# Patient Record
Sex: Male | Born: 1995 | Race: White | Hispanic: No | Marital: Single | Smoking: Never smoker
Health system: Southern US, Community
[De-identification: ages and names within clinical notes are randomized; demographics above are authoritative.]

---

## 2016-11-05 ENCOUNTER — Emergency Department
Admission: EM | Admit: 2016-11-05 | Discharge: 2016-11-05 | Disposition: A | Discharge status: Home / Self Care | Payer: PRIVATE HEALTH INSURANCE | Attending: Emergency Medicine | Admitting: Emergency Medicine

## 2016-11-05 ENCOUNTER — Emergency Department: Payer: PRIVATE HEALTH INSURANCE

## 2016-11-05 ENCOUNTER — Encounter: Payer: Self-pay | Admitting: Emergency Medicine

## 2016-11-05 DIAGNOSIS — S93401A Sprain of unspecified ligament of right ankle, initial encounter: Secondary | ICD-10-CM

## 2016-11-05 DIAGNOSIS — W1789XA Other fall from one level to another, initial encounter: Secondary | ICD-10-CM | POA: Insufficient documentation

## 2016-11-05 DIAGNOSIS — Y999 Unspecified external cause status: Secondary | ICD-10-CM | POA: Diagnosis not present

## 2016-11-05 DIAGNOSIS — Y929 Unspecified place or not applicable: Secondary | ICD-10-CM | POA: Insufficient documentation

## 2016-11-05 DIAGNOSIS — S91011A Laceration without foreign body, right ankle, initial encounter: Secondary | ICD-10-CM | POA: Diagnosis not present

## 2016-11-05 DIAGNOSIS — Y939 Activity, unspecified: Secondary | ICD-10-CM | POA: Insufficient documentation

## 2016-11-05 MED ORDER — IBUPROFEN 600 MG PO TABS: 600.0000 mg | ORAL_TABLET | Freq: Three times a day (TID) | ORAL | 0 refills | Status: AC | PRN

## 2016-11-05 NOTE — ED Provider Notes (Signed)
[facility] Emergency Department Provider Note  ____________________________________________   First MD Initiated Contact with Patient 11/05/16 [phone]     (approximate)  I have reviewed the triage vital signs and the nursing notes.   HISTORY  Chief Complaint Ankle Pain    HPI Birt Reinoso is a 21 y.o. male is here with  complaint of right ankle injury.patient reports falling off of a table and possibly 5 AM this morning. Patient has been unable to bear weight on his right ankle since the injury. He denies any head injury or loss of consciousness.she rates his pain as 6/10.   He denies any use of alcohol during this event.   History reviewed. No pertinent past medical history.  There are no active problems to display for this patient.   History reviewed. No pertinent surgical history.  Prior to Admission medications   Medication Sig Start Date End Date Taking? Authorizing Provider  ibuprofen (ADVIL,MOTRIN) 600 MG tablet Take 1 tablet (600 mg total) by mouth every 8 (eight) hours as needed. 11/05/16   Tommi Rumps, PA-C    Allergies Patient has no known allergies.  No family history on file.  Social History Social History  Substance Use Topics  . Smoking status: Never Smoker  . Smokeless tobacco: Never Used  . Alcohol use No    Review of Systems Constitutional: No fever/chills Eyes: No visual changes. ENT:  No trauma Cardiovascular: Denies chest pain. Respiratory: Denies shortness of breath. Gastrointestinal: No abdominal pain.  No nausea, no vomiting.   Musculoskeletal: Negative for back pain. positive right ankle pain. Skin: Negative for rash.positive abrasion right ankle. Neurological: Negative for headaches, focal weakness or numbness.  10-point ROS otherwise negative.  ____________________________________________   PHYSICAL EXAM:  VITAL SIGNS: ED Triage Vitals  Enc Vitals Group     BP 11/05/16 0604 (!) 121/48    Pulse Rate 11/05/16 0604 82     Resp 11/05/16 0604 20     Temp 11/05/16 0604 97.9 F (36.6 C)     Temp Source 11/05/16 0604 Oral     SpO2 11/05/16 0604 98 %     Weight 11/05/16 0603 265 lb (120.2 kg)     Height 11/05/16 0603  (1.88 m)     Head Circumference --      Peak Flow --      Pain Score 11/05/16 0603 6     Pain Loc --      Pain Edu? --      Excl. in GC? --     Constitutional: Alert and oriented. Well appearing and in no acute distress. Eyes: Conjunctivae are normal. PERRL. EOMI. Head: Atraumatic. Nose: No congestion/rhinnorhea. Neck: No stridor.   Cardiovascular: Normal rate, regular rhythm. Grossly normal heart sounds.  Good peripheral circulation. Respiratory: Normal respiratory effort.  No retractions. Lungs CTAB. Gastrointestinal: Soft and nontender. No distention.  Musculoskeletal: Ms. Patton Salles and lower extremities without any difficulty. Patient states he is unable to bear weight secondary to his injury. On examination there is no gross deformity of the lower right extremity. There is some tenderness on palpation of the right ankle and especially posteriorly. There is a very superficial abrasion noted posterior ankle. Achilles tendon appears to be well intact. Thompson's sign is negative for evidence of a rupture. Remaining skin without injury. Neurologic:  Normal speech and language. No gross focal neurologic deficits are appreciated. No gait instability. Skin:  Skin is warm, dry.  Duration is noted above. Psychiatric: Mood  and affect are normal. Speech and behavior are normal.  ____________________________________________   LABS (all labs ordered are listed, but only abnormal results are displayed)  Labs Reviewed - No data to display  RADIOLOGY  Right ankle per radiologist is negative for fracture. I, Tommi Rumps, personally viewed and evaluated these images (plain radiographs) as part of my medical decision making, as well as reviewing the written  report by the radiologist.  ____________________________________________   PROCEDURES  Procedure(s) performed: None  Procedures  Critical Care performed: No  ____________________________________________   INITIAL IMPRESSION / ASSESSMENT AND PLAN / ED COURSE  Pertinent labs & imaging results that were available during my care of the patient were reviewed by me and considered in my medical decision making (see chart for details).  Patient is placed in a Velcro stirrup ankle splint however he is still unable to bear weight. Patient was given a set of crutches to use. He is given a prescription for ibuprofen 600 mg 3 times a day with food. He is to ice and elevate his ankle as needed for pain and swelling. He will follow up with [facility] if any problems and Dr. Rosita Kea if any severe worsening of his ankle.      ____________________________________________   FINAL CLINICAL IMPRESSION(S) / ED DIAGNOSES  Final diagnoses:  Sprain of right ankle, unspecified ligament, initial encounter  Laceration of right ankle, initial encounter      NEW MEDICATIONS STARTED DURING THIS VISIT:  Discharge Medication List as of 11/05/2016  8:35 AM    START taking these medications   Details  ibuprofen (ADVIL,MOTRIN) 600 MG tablet Take 1 tablet (600 mg total) by mouth every 8 (eight) hours as needed., Starting Wed 11/05/2016, Print         Note:  This document was prepared using Dragon voice recognition software and may include unintentional dictation errors.    Tommi Rumps, PA-C 11/05/16 1125    Governor Rooks, MD 11/05/16 [phone]

## 2016-11-05 NOTE — ED Notes (Signed)
C/O right ankle pain.  + radial pulse.  + CMS.  NAD

## 2016-11-05 NOTE — Discharge Instructions (Signed)
Ice and elevate the ankle as needed for swelling. Watch abrasion for signs of infection. Ibuprofen 3 times a day with food as needed for pain and inflammation. Wear ankle brace for support. Follow up with [facility] if any continued problems. If any worsening of your ankle symptoms you  may call and make an appointment with [facility] clinic orthopedic department. Dr. Rosita Kea is on call today.  Use crutches for support

## 2016-11-05 NOTE — ED Triage Notes (Signed)
Pt to triage via w/c with no distress noted; pt reports at 5am falling off a table, injuring right ankle

## 2016-11-05 NOTE — ED Notes (Signed)
AAOx3.  Skin warm and dry.  NAD 

## 2017-04-25 ENCOUNTER — Emergency Department: Payer: PRIVATE HEALTH INSURANCE

## 2017-04-25 ENCOUNTER — Emergency Department
Admission: EM | Admit: 2017-04-25 | Discharge: 2017-04-25 | Disposition: A | Discharge status: Home / Self Care | Payer: PRIVATE HEALTH INSURANCE | Attending: Emergency Medicine | Admitting: Emergency Medicine

## 2017-04-25 DIAGNOSIS — R509 Fever, unspecified: Secondary | ICD-10-CM | POA: Diagnosis present

## 2017-04-25 DIAGNOSIS — B349 Viral infection, unspecified: Secondary | ICD-10-CM | POA: Diagnosis not present

## 2017-04-25 LAB — POCT RAPID STREP A: STREPTOCOCCUS, GROUP A SCREEN (DIRECT): NEGATIVE

## 2017-04-25 LAB — URINALYSIS, ROUTINE W REFLEX MICROSCOPIC
BACTERIA UA: NONE SEEN
BILIRUBIN URINE: NEGATIVE
Glucose, UA: NEGATIVE mg/dL
Hgb urine dipstick: NEGATIVE
KETONES UR: 20 mg/dL — AB
Leukocytes, UA: NEGATIVE
NITRITE: NEGATIVE
PROTEIN: 30 mg/dL — AB
Specific Gravity, Urine: 1.021 (ref 1.005–1.030)
pH: 5 (ref 5.0–8.0)

## 2017-04-25 LAB — CBC
HEMATOCRIT: 44.2 % (ref 40.0–52.0)
Hemoglobin: 15.7 g/dL (ref 13.0–18.0)
MCH: 31.7 pg (ref 26.0–34.0)
MCHC: 35.6 g/dL (ref 32.0–36.0)
MCV: 88.9 fL (ref 80.0–100.0)
Platelets: 171 10*3/uL (ref 150–440)
RBC: 4.97 MIL/uL (ref 4.40–5.90)
RDW: 12.8 % (ref 11.5–14.5)
WBC: 8.8 10*3/uL (ref 3.8–10.6)

## 2017-04-25 LAB — BASIC METABOLIC PANEL
ANION GAP: 11 (ref 5–15)
BUN: 10 mg/dL (ref 6–20)
CALCIUM: 9.4 mg/dL (ref 8.9–10.3)
CO2: 25 mmol/L (ref 22–32)
CREATININE: 0.9 mg/dL (ref 0.61–1.24)
Chloride: 101 mmol/L (ref 101–111)
Glucose, Bld: 108 mg/dL — ABNORMAL HIGH (ref 65–99)
Potassium: 3.5 mmol/L (ref 3.5–5.1)
SODIUM: 137 mmol/L (ref 135–145)

## 2017-04-25 LAB — LACTIC ACID, PLASMA: Lactic Acid, Venous: 0.7 mmol/L (ref 0.5–1.9)

## 2017-04-25 LAB — MONONUCLEOSIS SCREEN: MONO SCREEN: NEGATIVE

## 2017-04-25 MED ORDER — IBUPROFEN 800 MG PO TABS
800.0000 mg | ORAL_TABLET | Freq: Once | ORAL | Status: AC
  Administered 2017-04-25: 800 mg via ORAL

## 2017-04-25 MED ORDER — IBUPROFEN 800 MG PO TABS
ORAL_TABLET | ORAL | Status: AC
  Filled 2017-04-25: qty 1

## 2017-04-25 MED ORDER — ACETAMINOPHEN 500 MG PO TABS
1000.0000 mg | ORAL_TABLET | Freq: Once | ORAL | Status: AC
  Administered 2017-04-25: 1000 mg via ORAL
  Filled 2017-04-25: qty 2

## 2017-04-25 NOTE — ED Provider Notes (Signed)
[facility] Emergency Department Provider Note  ____________________________________________   First MD Initiated Contact with Patient 04/25/17 0451     (approximate)  I have reviewed the triage vital signs and the nursing notes.   HISTORY  Chief Complaint Fever   HPI Philip Wheeler is a 21 y.o. male without any chronic medical problems was presented to the emergency department today with 4 days of fever. He says that his illness started this past Monday with ulcerations to the inside of his mouth. He says that he has had a sore throat with difficulty swallowing. No cough but then developed fever 3 days ago. He says that his fever has been less responsive to ibuprofen, and her milligrams, at home. He says that he is also had body aches and mild chest pain that hurts worse when he breathes in. 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 Denies any pressure to his ears at this time. Says that he had nausea prior to arrival but no vomiting.   No past medical history on file.  There are no active problems to display for this patient.   No past surgical history on file.  Prior to Admission medications   Medication Sig Start Date End Date Taking? Authorizing Provider  ibuprofen (ADVIL,MOTRIN) 600 MG tablet Take 1 tablet (600 mg total) by mouth every 8 (eight) hours as needed. 11/05/16   Tommi Rumps, PA-C    Allergies Patient has no known allergies.  No family history on file.  Social History Social History  Substance Use Topics  . Smoking status: Never Smoker  . Smokeless tobacco: Never Used  . Alcohol use No    Review of Systems  Constitutional:  fever/chills Eyes: No visual changes. ENT: as above Cardiovascular: Denies chest pain. Respiratory: Denies shortness of breath. Gastrointestinal: No abdominal pain.  no vomiting.  No diarrhea.  No constipation. Genitourinary:  Negative for dysuria. Musculoskeletal: Negative for back pain. Skin: Negative for rash. Neurological: Negative for headaches, focal weakness or numbness.   ____________________________________________   PHYSICAL EXAM:  VITAL SIGNS: ED Triage Vitals  Enc Vitals Group     BP 04/25/17 0035 (!) 168/69     Pulse Rate 04/25/17 0035 98     Resp 04/25/17 0035 14     Temp 04/25/17 0035 (!) 103.1 F (39.5 C)     Temp Source 04/25/17 0035 Oral     SpO2 04/25/17 0035 98 %     Weight 04/25/17 0036 270 lb (122.5 kg)     Height 04/25/17 0036  (1.88 m)     Head Circumference --      Peak Flow --      Pain Score 04/25/17 0034 8     Pain Loc --      Pain Edu? --      Excl. in GC? --     Constitutional: Alert and oriented. Well appearing and in no acute distress. Eyes: Conjunctivae are normal.  Head: Atraumatic. Nose: No congestion/rhinnorhea. Mouth/Throat: Mucous membranes are moist. multiple ulcerations to the gumline as well as exudate which is patchy to the bilateral tonsils. No tonsillar swelling. Neck: No stridor.   Cardiovascular: Normal rate, regular rhythm. Grossly normal heart sounds.   Respiratory: Normal respiratory effort.  No retractions. Lungs CTAB. Gastrointestinal: Soft and nontender. No distention. no palpable organomegaly Musculoskeletal: No lower extremity tenderness nor edema.  No joint effusions. Neurologic:  Normal speech and language. No  gross focal neurologic deficits are appreciated. Skin:  Skin is warm, dry and intact. No rash noted. Psychiatric: Mood and affect are normal. Speech and behavior are normal.  ____________________________________________   LABS (all labs ordered are listed, but only abnormal results are displayed)  Labs Reviewed  BASIC METABOLIC PANEL - Abnormal; Notable for the following:       Result Value   Glucose, Bld 108 (*)    All other components within normal limits  URINALYSIS, ROUTINE W REFLEX MICROSCOPIC - Abnormal; Notable  for the following:    Color, Urine YELLOW (*)    APPearance CLEAR (*)    Ketones, ur 20 (*)    Protein, ur 30 (*)    Squamous Epithelial / LPF 0-5 (*)    All other components within normal limits  CULTURE, GROUP A STREP ([facility])  CBC  LACTIC ACID, PLASMA  MONONUCLEOSIS SCREEN  LACTIC ACID, PLASMA  POCT RAPID STREP A   ____________________________________________  EKG   ____________________________________________  RADIOLOGY  no acute finding on the chest x-ray ____________________________________________   PROCEDURES  Procedure(s) performed:   Procedures  Critical Care performed:   ____________________________________________   INITIAL IMPRESSION / ASSESSMENT AND PLAN / ED COURSE  Pertinent labs & imaging results that were available during my care of the patient were reviewed by me and considered in my medical decision making (see chart for details).  ----------------------------------------- 6:45 AM on 04/25/2017 -----------------------------------------  Patient at this time without any further complaints. He has had a negative mono screen. Likely viral illness. Possible hand-foot-and-mouth disease. Strep culture is pending. We discussed staying hydrated and using ibuprofen as Tylenol as needed for body aches and fever. We also discussed hand hygiene as this condition is likely contagious. The patient may follow-up with his clinic in the line and may return to the emergency department for any worsening or concerning symptoms. He is understanding of the diagnosis as well as the plan and willing to comply.  unlikely to be more serious illness such as HIV. However, did discuss follow-up at the student clinic with the patient and his understanding that further testing may be warranted if the symptoms persist.      ____________________________________________   FINAL CLINICAL IMPRESSION(S) / ED DIAGNOSES  viral illness    NEW MEDICATIONS STARTED DURING THIS  VISIT:  New Prescriptions   No medications on file     Note:  This document was prepared using Dragon voice recognition software and may include unintentional dictation errors.     Myrna Blazer, MD 04/25/17 [id number]

## 2017-04-25 NOTE — ED Triage Notes (Signed)
Pt presents c/o fever and mouth sore. Pt reports fever yesterday of 104. Pt states took tylenol x2 hrs PTA and motrin x5 hours ago. Fever in triage 103.1 Pt laughing with friends in triage.

## 2017-04-27 LAB — CULTURE, GROUP A STREP (THRC)

## 2018-02-14 IMAGING — CR DG CHEST 2V
1 series · 2 of 2 positions shown · non-contrast
Comparison: None.

CLINICAL DATA: Fever and mouth sore.  Some chest pain today.

EXAM:
CHEST  2 VIEW

[Series 1: dg chest 2 view · 0.14mm/px · 2 of 2 slices shown]
[im 1/2]
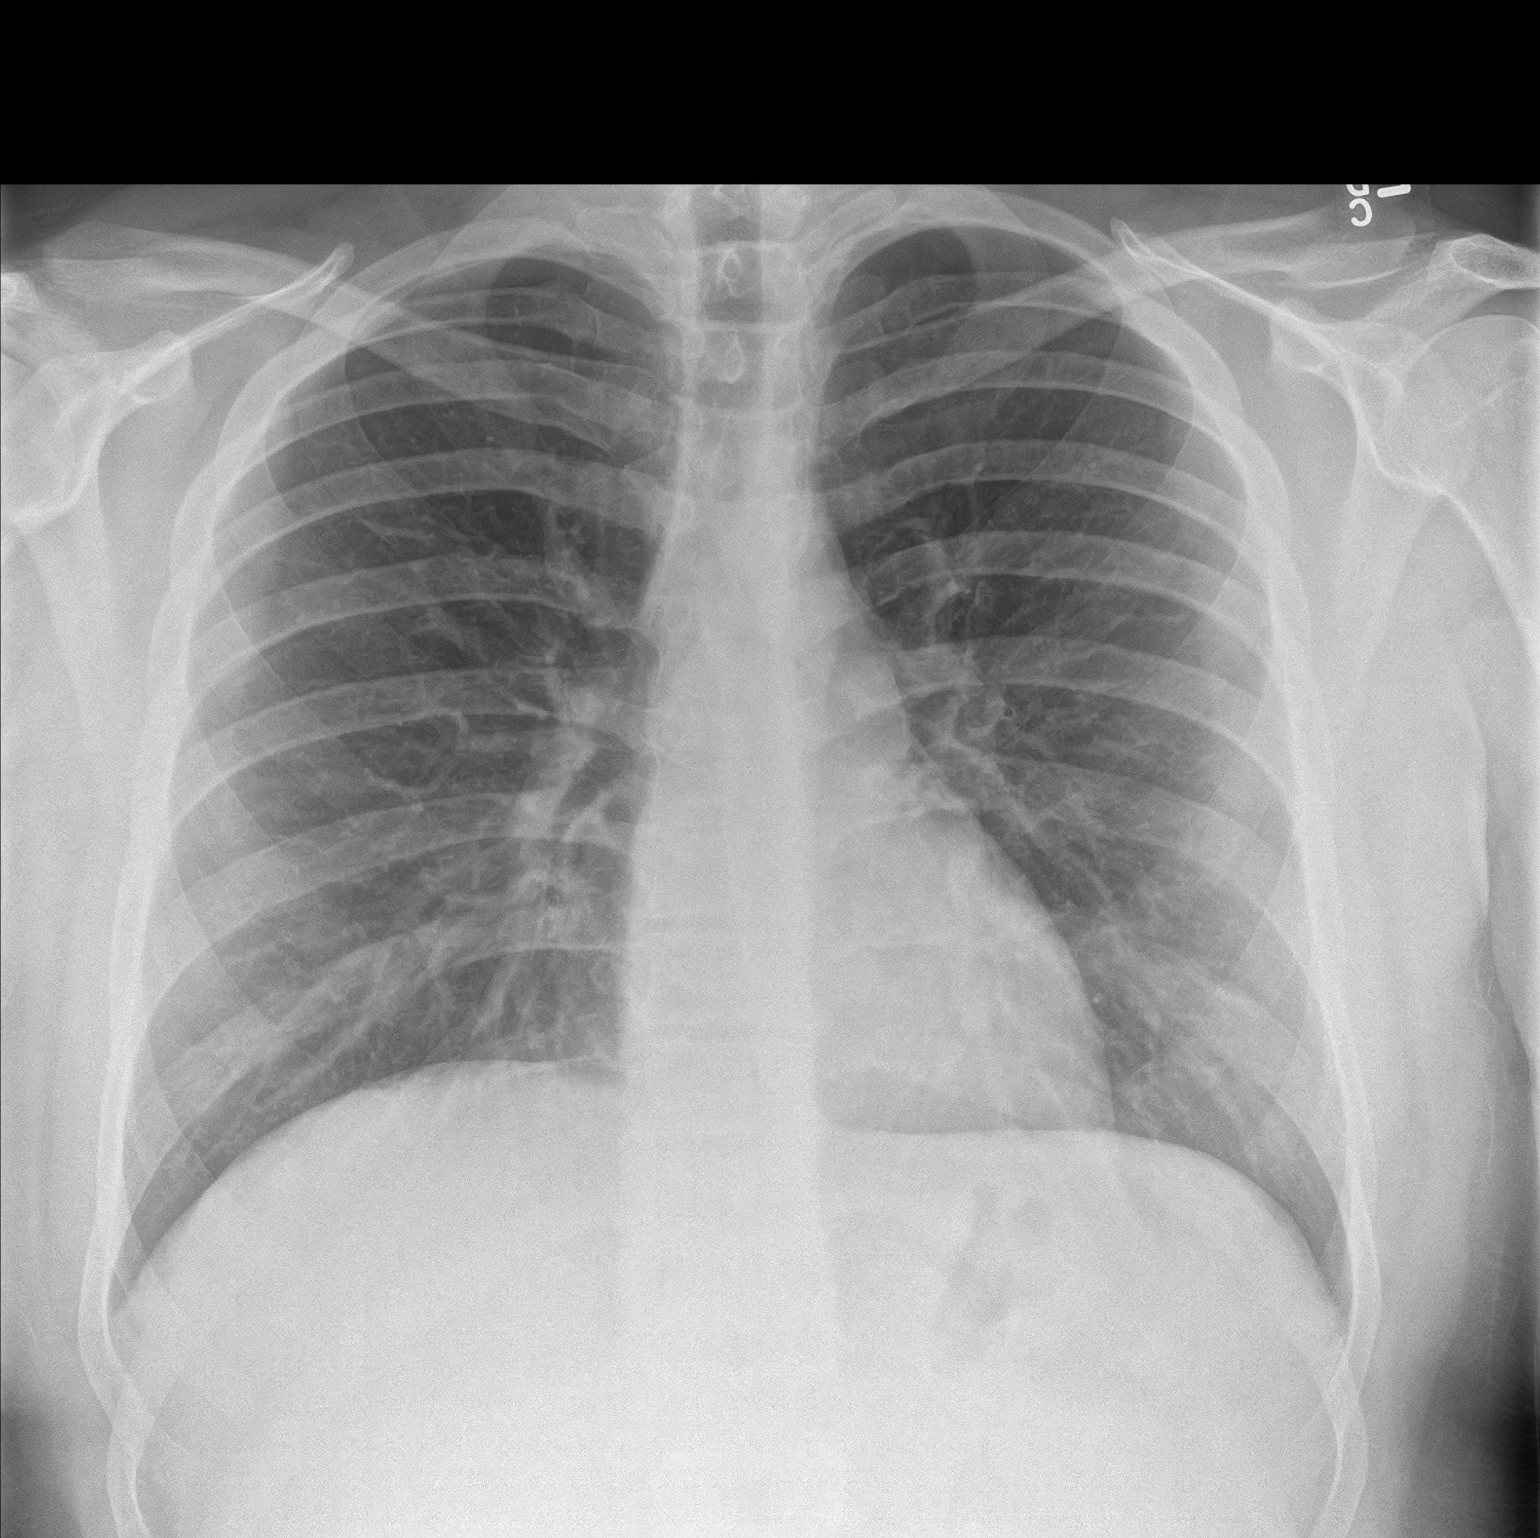
[im 2/2]
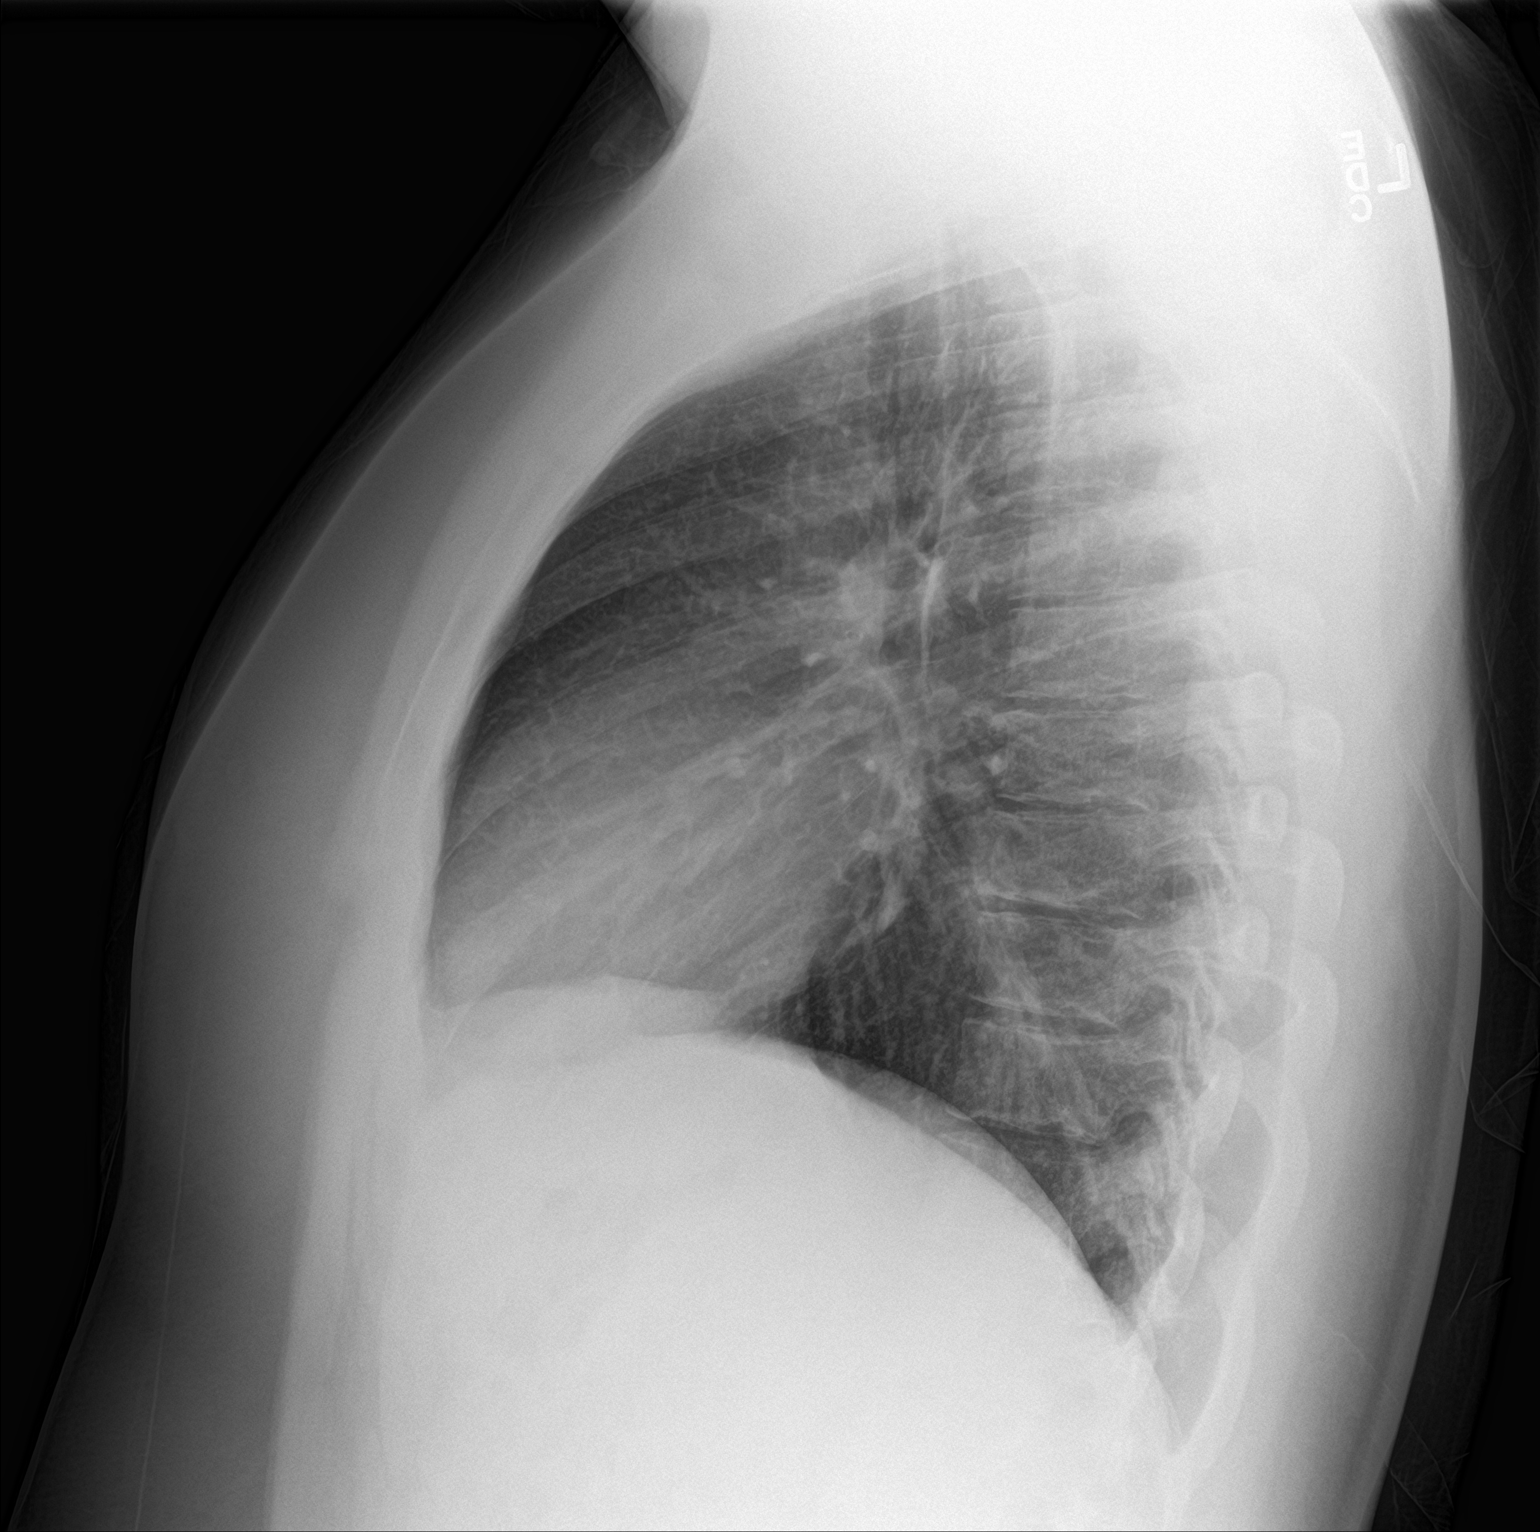

[2 of 2 positions shown; findings below may reference images not displayed]

FINDINGS: The heart size and mediastinal contours are within normal limits.
Both lungs are clear. The visualized skeletal structures are
unremarkable.
IMPRESSION: No active cardiopulmonary disease.

## 2019-05-24 ENCOUNTER — Other Ambulatory Visit: Payer: Self-pay | Admitting: *Deleted

## 2019-05-24 DIAGNOSIS — Z20822 Contact with and (suspected) exposure to covid-19: Secondary | ICD-10-CM

## 2019-05-26 LAB — NOVEL CORONAVIRUS, NAA: SARS-CoV-2, NAA: DETECTED — AB

## 2019-11-04 ENCOUNTER — Ambulatory Visit: Payer: PRIVATE HEALTH INSURANCE | Attending: Internal Medicine

## 2019-11-04 DIAGNOSIS — Z23 Encounter for immunization: Secondary | ICD-10-CM

## 2019-11-04 NOTE — Progress Notes (Signed)
   Covid-19 Vaccination Clinic  Name:  Chidera Thivierge    MRN: [id number] DOB: Nov 20, 1995  11/04/2019  Mr. Penny-Kosser was observed post Covid-19 immunization for 15 minutes without incident. He was provided with Vaccine Information Sheet and instruction to access the V-Safe system.   Mr. Gasparyan was instructed to call 911 with any severe reactions post vaccine: Marland Kitchen Difficulty breathing  . Swelling of face and throat  . A fast heartbeat  . A bad rash all over body  . Dizziness and weakness   Immunizations Administered    Name Date Dose VIS Date Route   Moderna COVID-19 Vaccine 11/04/2019  1:14 PM 0.5 mL 07/05/2019 Intramuscular   Manufacturer: Moderna   Lot: [id number]   NDC: 05259-102-89
